# Patient Record
Sex: Male | Born: 1970 | Race: White | Hispanic: No | Marital: Married | Smoking: Former smoker
Health system: Southern US, Community
[De-identification: ages and names within clinical notes are randomized; demographics above are authoritative.]

---

## 2007-03-11 ENCOUNTER — Emergency Department: Payer: Self-pay | Admitting: Internal Medicine

## 2007-03-20 ENCOUNTER — Emergency Department: Payer: Self-pay

## 2008-10-19 IMAGING — CR LEFT INDEX FINGER 2+V
1 series · 3 of 3 positions shown · non-contrast
Comparison: none

REASON FOR EXAM: crush injury
COMMENTS:

PROCEDURE:     DXR - DXR FINGER INDEX 2ND DIGIT LT HA  - March 11, 2007  [DATE]
RESULT:     There is opaque material projected over the soft tissues of the
distal phalanx and compatible with opaque material on the skin or in soft
tissues.

[Series 1: view not recorded · 0.17mm/px · 3 of 3 slices shown]
[im 1/3]
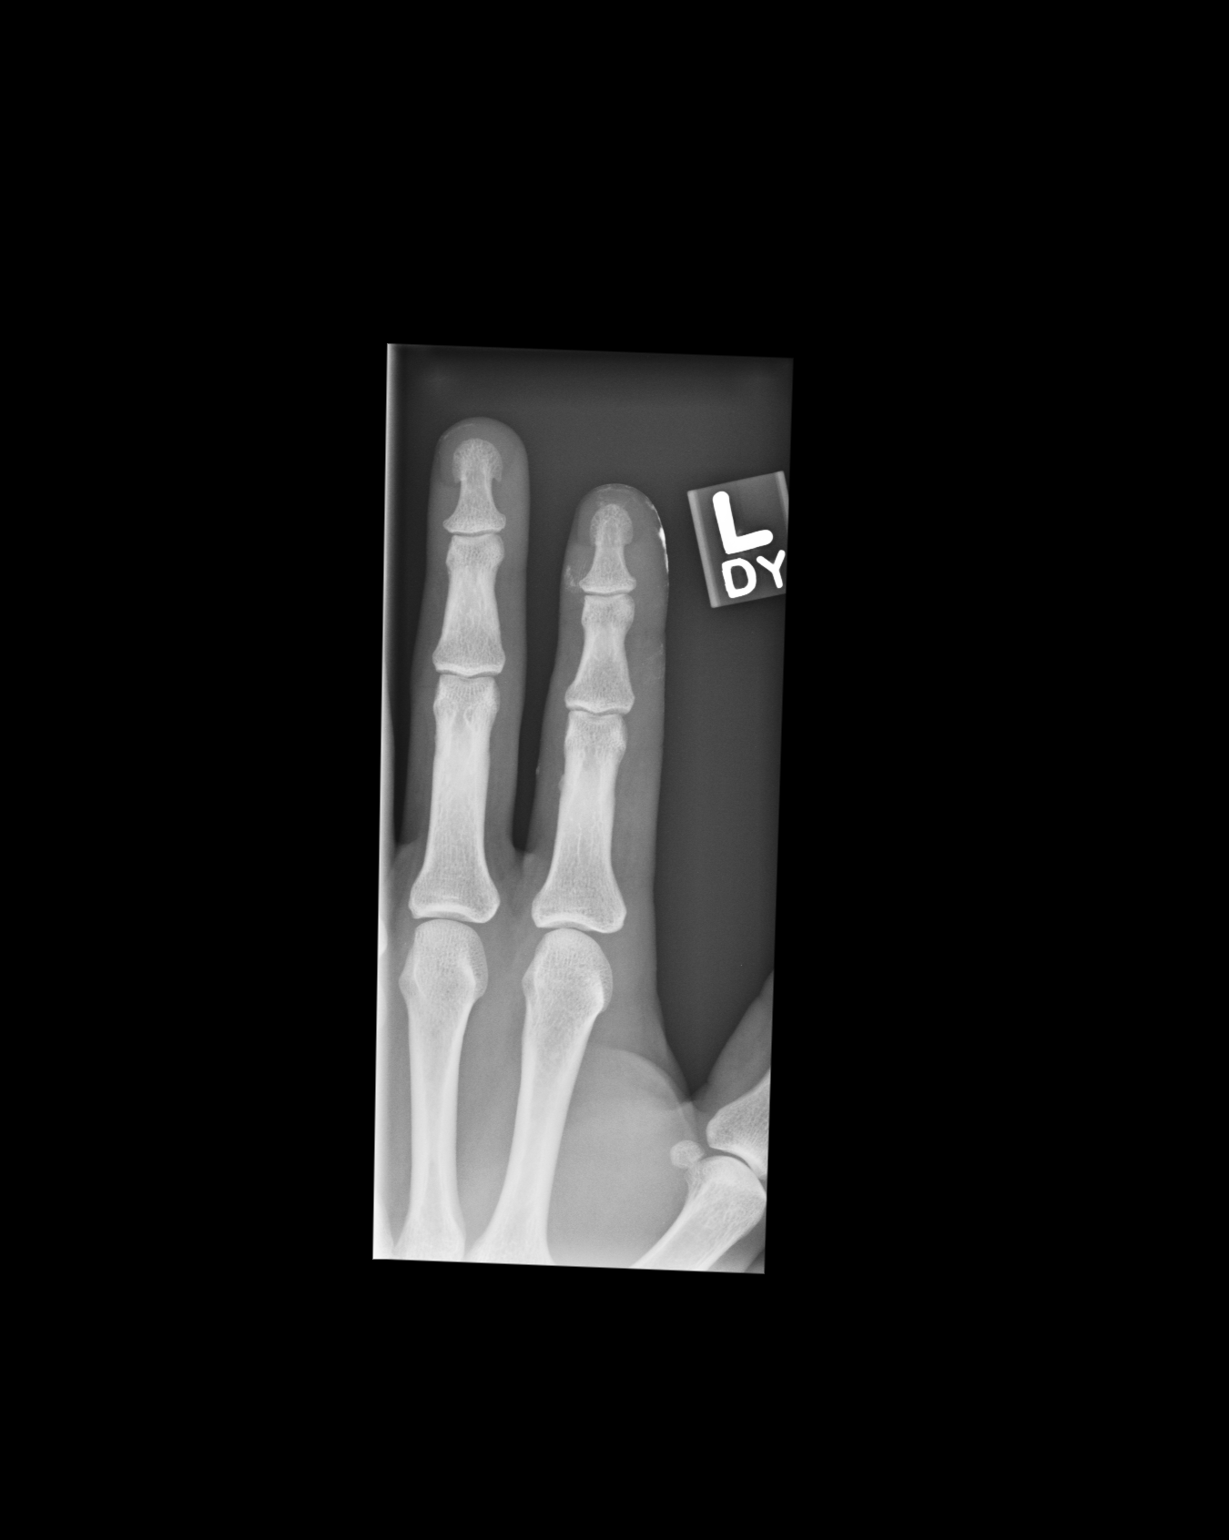
[im 2/3]
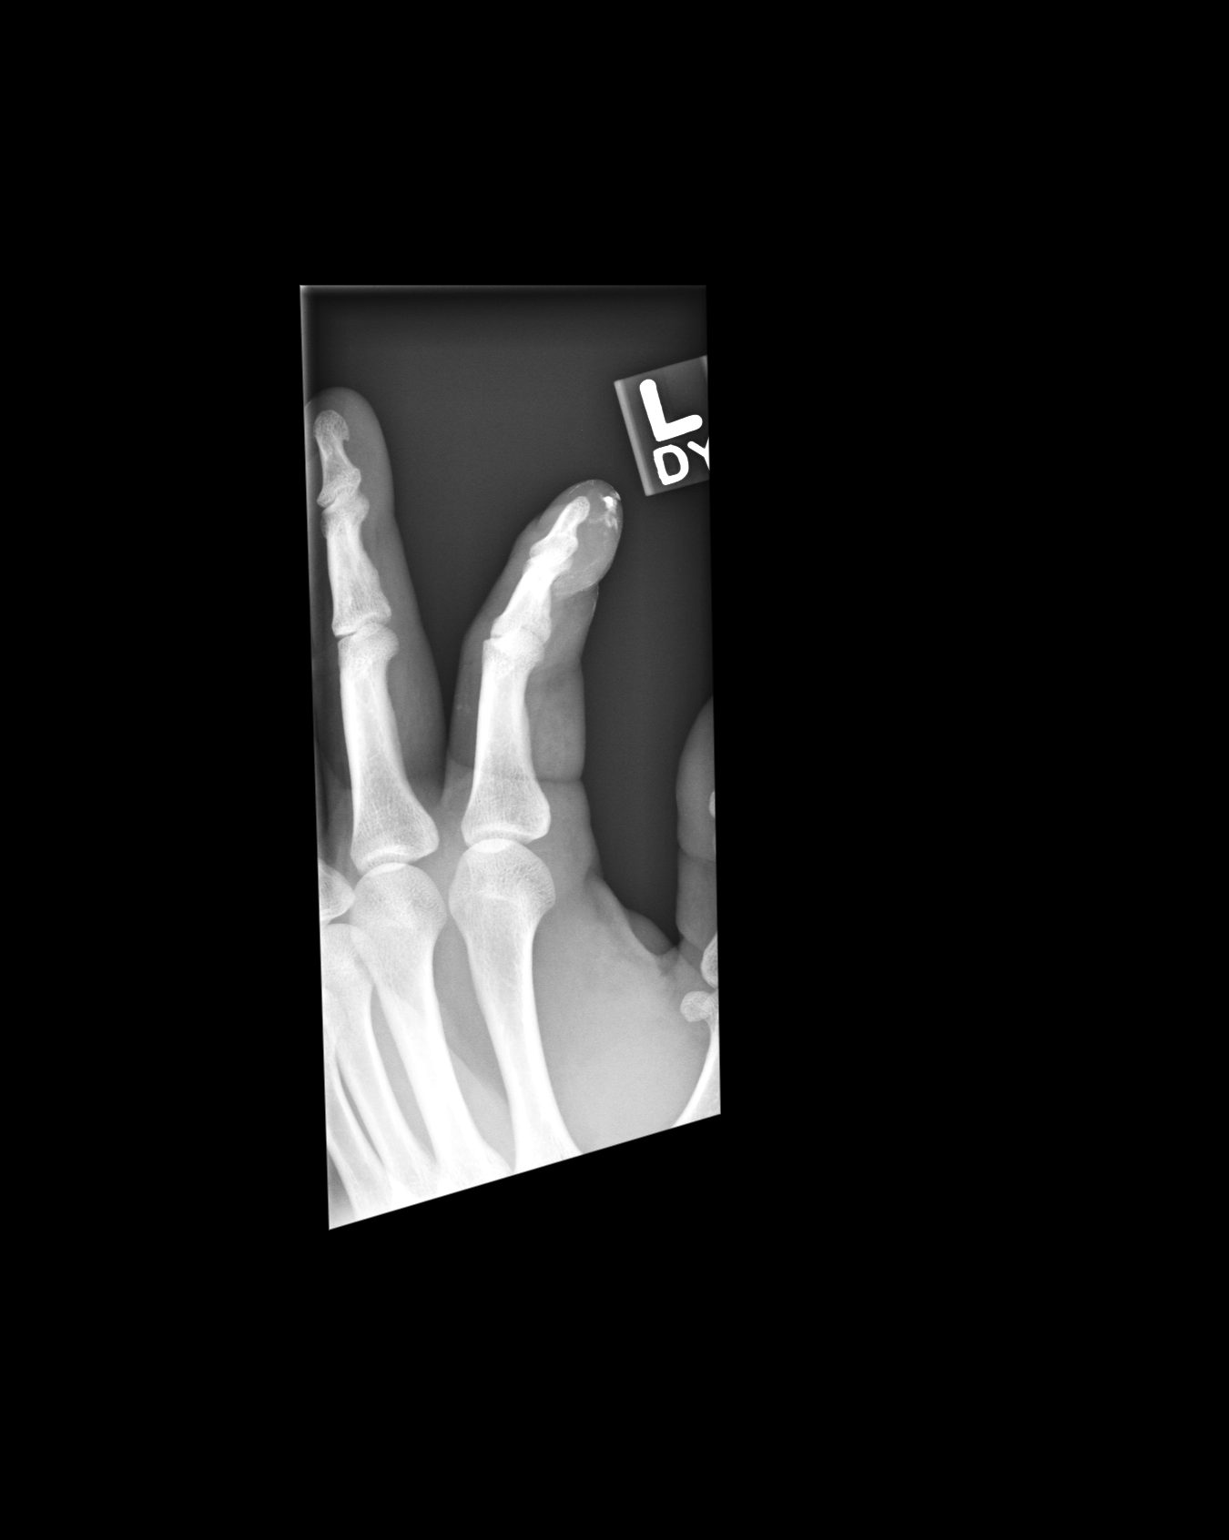
[im 3/3]
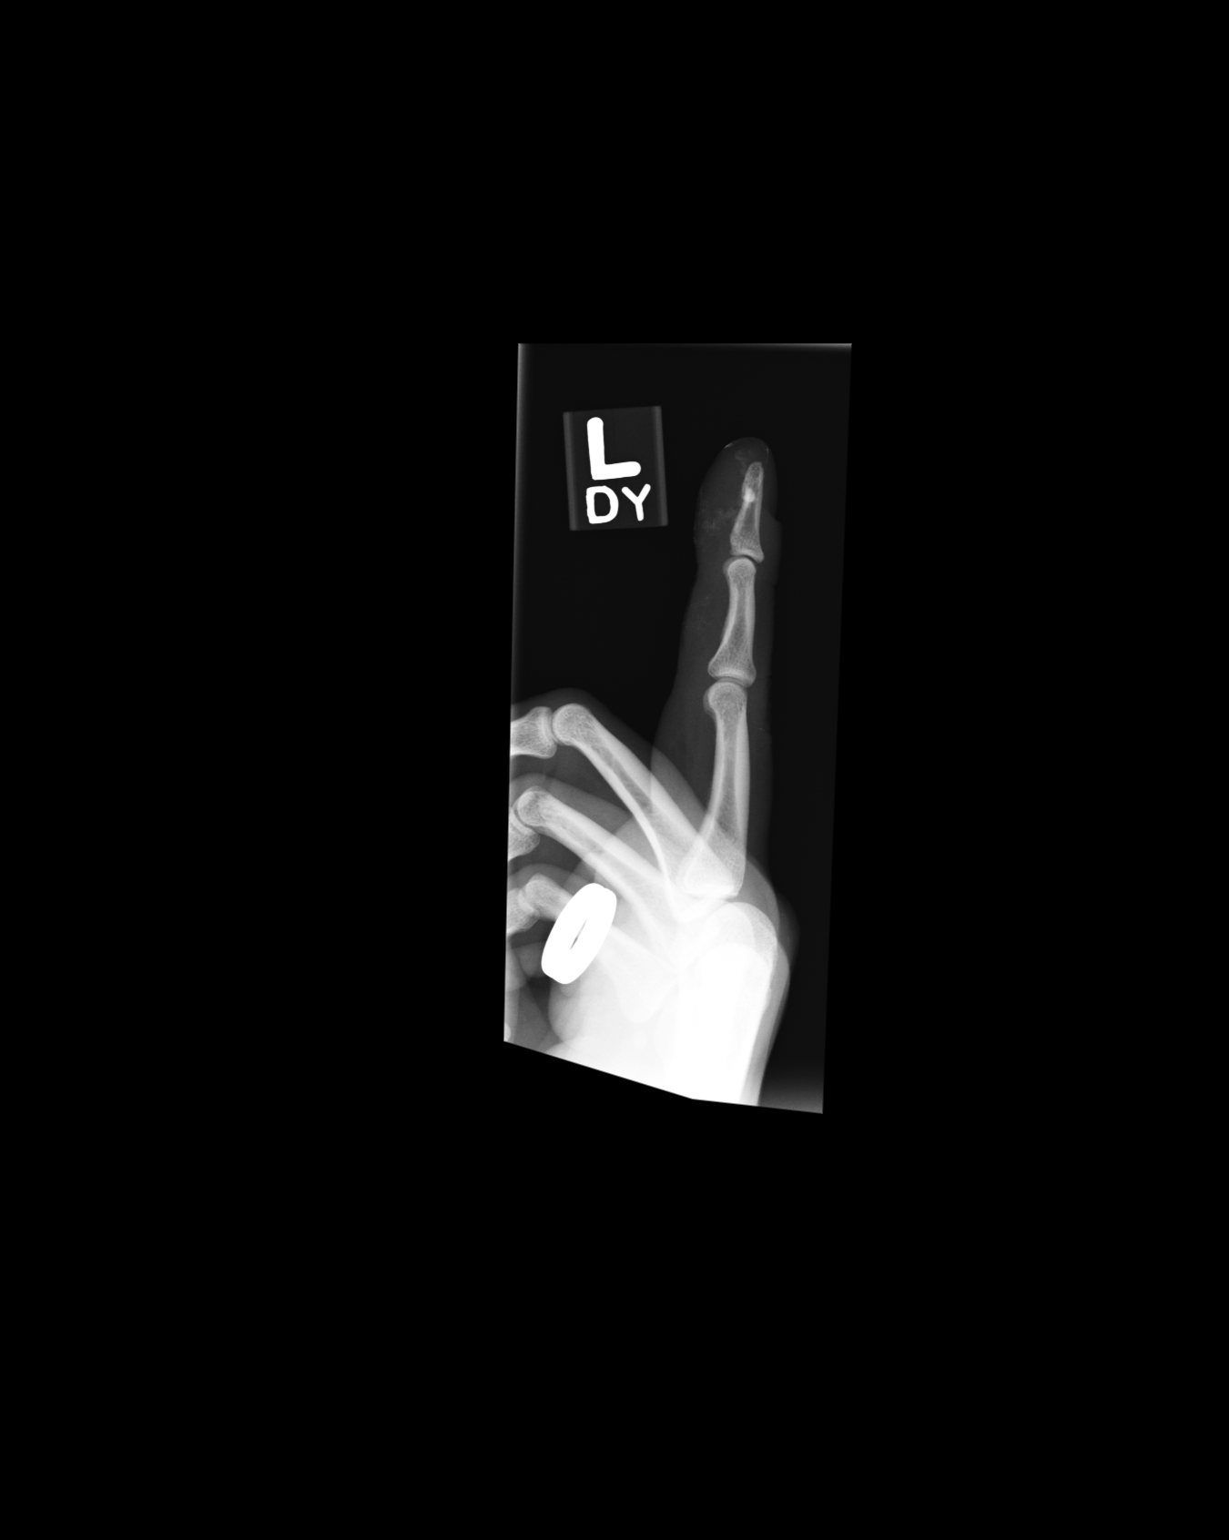

[3 of 3 positions shown; findings below may reference images not displayed]

IMPRESSION: 1. No fracture or other acute bony abnormality is seen.
2. There is opaque material projected over the soft tissues distally and
which appears to be primarily on the skin.

## 2020-08-04 ENCOUNTER — Encounter: Payer: Self-pay | Admitting: Podiatry

## 2020-08-04 ENCOUNTER — Other Ambulatory Visit: Payer: Self-pay

## 2020-08-04 ENCOUNTER — Ambulatory Visit (INDEPENDENT_AMBULATORY_CARE_PROVIDER_SITE_OTHER): Payer: 59

## 2020-08-04 ENCOUNTER — Ambulatory Visit (INDEPENDENT_AMBULATORY_CARE_PROVIDER_SITE_OTHER): Payer: 59 | Admitting: Podiatry

## 2020-08-04 DIAGNOSIS — M62462 Contracture of muscle, left lower leg: Secondary | ICD-10-CM

## 2020-08-04 DIAGNOSIS — M722 Plantar fascial fibromatosis: Secondary | ICD-10-CM

## 2020-08-04 DIAGNOSIS — M216X1 Other acquired deformities of right foot: Secondary | ICD-10-CM

## 2020-08-04 DIAGNOSIS — M21861 Other specified acquired deformities of right lower leg: Secondary | ICD-10-CM

## 2020-08-04 DIAGNOSIS — M216X2 Other acquired deformities of left foot: Secondary | ICD-10-CM | POA: Diagnosis not present

## 2020-08-04 DIAGNOSIS — K219 Gastro-esophageal reflux disease without esophagitis: Secondary | ICD-10-CM | POA: Insufficient documentation

## 2020-08-04 DIAGNOSIS — M62461 Contracture of muscle, right lower leg: Secondary | ICD-10-CM

## 2020-08-04 DIAGNOSIS — M21862 Other specified acquired deformities of left lower leg: Secondary | ICD-10-CM

## 2020-08-04 MED ORDER — MELOXICAM 15 MG PO TABS: 15.0000 mg | ORAL_TABLET | Freq: Every day | ORAL | 3 refills | Status: AC

## 2020-08-04 NOTE — Progress Notes (Signed)
  Subjective:  Patient ID: Richard Lozano, male    DOB: March 28, 1971,  MRN: [id number]  Chief Complaint  Patient presents with  . Foot Pain    Patient presents today for left heel and bilat arch pain x 3 weeks.  He says "it hurts more when Im standing up and its a nagging ache."  He says he occasionally has arch pain bilat feet.  He also c/o numbness at base of right great toenail x 3-4 weeks    49 y.o. male presents with the above complaint. History confirmed with patient.  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  He has had a number of orthopedic injuries in the last year and a half and has not been exercising until recently.  Pain began after he resumed exercise and gym work.  Objective:  Physical Exam: warm, good capillary refill, no trophic changes or ulcerative lesions, normal DP and PT pulses and normal sensory exam.  Bilaterally he has Mild pain in the mid plantar fascia, he has significant gastroc equinus with a positive Silfverskiold test.  No sharp pain at the insertion of the plantar fascia on the plantar heel.  He has pes cavus with a plantarflexed first ray.   Radiographs: X-ray of both feet: Bilateral pes cavus foot type with tiny plantar and posterior calcaneal enthesophytes Assessment:   1. Plantar fasciitis   2. Acquired bilateral pes cavus   3. Gastrocnemius equinus of left lower extremity   4. Gastrocnemius equinus of right lower extremity      Plan:  Patient was evaluated and treated and all questions answered.   Symptoms most closely resolving plantar fasciitis although they are not classic in nature.  He has pain in the mid plantar fascia.  I think it still be good for him to pursue conservative treatment for this and begin anti-inflammatory use and stretching.  This should help with his significant gastrocnemius equinus which I think is increasing plantar pressure and strain.  Hopefully this is an overuse injury after beginning exercise recently.   With his pes cavus foot type, I think that prefabricated orthoses were not doing much good and he would benefit from a custom molded orthosis.  He will consider this for long-term.  Should do well with an arch support to fill the arch and either a reverse Morton's first ray cut out to allow accommodation of his plantarflexed first ray.  -Rx for meloxicam 15 mg sent to pharmacy -Stretching icing reviewed and exercise were given  Return in about 6 weeks (around 09/15/2020).

## 2020-08-04 NOTE — Patient Instructions (Signed)
Check with your insurance if they cover custom molded orthotics. Check with them for the procedure code L3020 (orthotics code) and the diagnosis code (M72.2)   Plantar Fasciitis (Heel Spur Syndrome) with Rehab The plantar fascia is a fibrous, ligament-like, soft-tissue structure that spans the bottom of the foot. Plantar fasciitis is a condition that causes pain in the foot due to inflammation of the tissue. SYMPTOMS   Pain and tenderness on the underneath side of the foot.  Pain that worsens with standing or walking. CAUSES  Plantar fasciitis is caused by irritation and injury to the plantar fascia on the underneath side of the foot. Common mechanisms of injury include:  Direct trauma to bottom of the foot.  Damage to a small nerve that runs under the foot where the main fascia attaches to the heel bone.  Stress placed on the plantar fascia due to bone spurs. RISK INCREASES WITH:   Activities that place stress on the plantar fascia (running, jumping, pivoting, or cutting).  Poor strength and flexibility.  Improperly fitted shoes.  Tight calf muscles.  Flat feet.  Failure to warm-up properly before activity.  Obesity. PREVENTION  Warm up and stretch properly before activity.  Allow for adequate recovery between workouts.  Maintain physical fitness:  Strength, flexibility, and endurance.  Cardiovascular fitness.  Maintain a health body weight.  Avoid stress on the plantar fascia.  Wear properly fitted shoes, including arch supports for individuals who have flat feet.  PROGNOSIS  If treated properly, then the symptoms of plantar fasciitis usually resolve without surgery. However, occasionally surgery is necessary.  RELATED COMPLICATIONS   Recurrent symptoms that may result in a chronic condition.  Problems of the lower back that are caused by compensating for the injury, such as limping.  Pain or weakness of the foot during push-off following  surgery.  Chronic inflammation, scarring, and partial or complete fascia tear, occurring more often from repeated injections.  TREATMENT  Treatment initially involves the use of ice and medication to help reduce pain and inflammation. The use of strengthening and stretching exercises may help reduce pain with activity, especially stretches of the Achilles tendon. These exercises may be performed at home or with a therapist. Your caregiver may recommend that you use heel cups of arch supports to help reduce stress on the plantar fascia. Occasionally, corticosteroid injections are given to reduce inflammation. If symptoms persist for greater than 6 months despite non-surgical (conservative), then surgery may be recommended.   MEDICATION   If pain medication is necessary, then nonsteroidal anti-inflammatory medications, such as aspirin and ibuprofen, or other minor pain relievers, such as acetaminophen, are often recommended.  Do not take pain medication within 7 days before surgery.  Prescription pain relievers may be given if deemed necessary by your caregiver. Use only as directed and only as much as you need.  Corticosteroid injections may be given by your caregiver. These injections should be reserved for the most serious cases, because they may only be given a certain number of times.  HEAT AND COLD  Cold treatment (icing) relieves pain and reduces inflammation. Cold treatment should be applied for 10 to 15 minutes every 2 to 3 hours for inflammation and pain and immediately after any activity that aggravates your symptoms. Use ice packs or massage the area with a piece of ice (ice massage).  Heat treatment may be used prior to performing the stretching and strengthening activities prescribed by your caregiver, physical therapist, or athletic trainer. Use a heat pack  or soak the injury in warm water.  SEEK IMMEDIATE MEDICAL CARE IF:  Treatment seems to offer no benefit, or the condition  worsens.  Any medications produce adverse side effects.  EXERCISES- RANGE OF MOTION (ROM) AND STRETCHING EXERCISES - Plantar Fasciitis (Heel Spur Syndrome) These exercises may help you when beginning to rehabilitate your injury. Your symptoms may resolve with or without further involvement from your physician, physical therapist or athletic trainer. While completing these exercises, remember:   Restoring tissue flexibility helps normal motion to return to the joints. This allows healthier, less painful movement and activity.  An effective stretch should be held for at least 30 seconds.  A stretch should never be painful. You should only feel a gentle lengthening or release in the stretched tissue.  RANGE OF MOTION - Toe Extension, Flexion  Sit with your right / left leg crossed over your opposite knee.  Grasp your toes and gently pull them back toward the top of your foot. You should feel a stretch on the bottom of your toes and/or foot.  Hold this stretch for 10 seconds.  Now, gently pull your toes toward the bottom of your foot. You should feel a stretch on the top of your toes and or foot.  Hold this stretch for 10 seconds. Repeat  times. Complete this stretch 3 times per day.   RANGE OF MOTION - Ankle Dorsiflexion, Active Assisted  Remove shoes and sit on a chair that is preferably not on a carpeted surface.  Place right / left foot under knee. Extend your opposite leg for support.  Keeping your heel down, slide your right / left foot back toward the chair until you feel a stretch at your ankle or calf. If you do not feel a stretch, slide your bottom forward to the edge of the chair, while still keeping your heel down.  Hold this stretch for 10 seconds. Repeat 3 times. Complete this stretch 2 times per day.   These exercises are the most important ones for you!: STRETCH  Gastroc, Standing  Place hands on wall.  Extend right / left leg, keeping the front knee somewhat  bent.  Slightly point your toes inward on your back foot.  Keeping your right / left heel on the floor and your knee straight, shift your weight toward the wall, not allowing your back to arch.  You should feel a gentle stretch in the right / left calf. Hold this position for 10 seconds. Repeat 3 times. Complete this stretch 2 times per day.  STRETCH  Soleus, Standing  Place hands on wall.  Extend right / left leg, keeping the other knee somewhat bent.  Slightly point your toes inward on your back foot.  Keep your right / left heel on the floor, bend your back knee, and slightly shift your weight over the back leg so that you feel a gentle stretch deep in your back calf.  Hold this position for 10 seconds. Repeat 3 times. Complete this stretch 2 times per day.  STRETCH  Gastrocsoleus, Standing  Note: This exercise can place a lot of stress on your foot and ankle. Please complete this exercise only if specifically instructed by your caregiver.   Place the ball of your right / left foot on a step, keeping your other foot firmly on the same step.  Hold on to the wall or a rail for balance.  Slowly lift your other foot, allowing your body weight to press your heel down over  the edge of the step.  You should feel a stretch in your right / left calf.  Hold this position for 10 seconds.  Repeat this exercise with a slight bend in your right / left knee. Repeat 3 times. Complete this stretch 2 times per day.   STRENGTHENING EXERCISES - Plantar Fasciitis (Heel Spur Syndrome)  These exercises may help you when beginning to rehabilitate your injury. They may resolve your symptoms with or without further involvement from your physician, physical therapist or athletic trainer. While completing these exercises, remember:   Muscles can gain both the endurance and the strength needed for everyday activities through controlled exercises.  Complete these exercises as instructed by your  physician, physical therapist or athletic trainer. Progress the resistance and repetitions only as guided.  STRENGTH - Towel Curls  Sit in a chair positioned on a non-carpeted surface.  Place your foot on a towel, keeping your heel on the floor.  Pull the towel toward your heel by only curling your toes. Keep your heel on the floor. Repeat 3 times. Complete this exercise 2 times per day.  STRENGTH - Ankle Inversion  Secure one end of a rubber exercise band/tubing to a fixed object (table, pole). Loop the other end around your foot just before your toes.  Place your fists between your knees. This will focus your strengthening at your ankle.  Slowly, pull your big toe up and in, making sure the band/tubing is positioned to resist the entire motion.  Hold this position for 10 seconds.  Have your muscles resist the band/tubing as it slowly pulls your foot back to the starting position. Repeat 3 times. Complete this exercises 2 times per day.  Document Released: 07/24/2005 Document Revised: 10/16/2011 Document Reviewed: 11/05/2008 [facility] Patient Information 2014 [place], [place].

## 2020-09-15 ENCOUNTER — Other Ambulatory Visit: Payer: Self-pay

## 2020-09-15 ENCOUNTER — Encounter: Payer: Self-pay | Admitting: Podiatry

## 2020-09-15 ENCOUNTER — Ambulatory Visit: Payer: 59 | Admitting: Podiatry

## 2020-09-15 DIAGNOSIS — M21861 Other specified acquired deformities of right lower leg: Secondary | ICD-10-CM

## 2020-09-15 DIAGNOSIS — M216X1 Other acquired deformities of right foot: Secondary | ICD-10-CM

## 2020-09-15 DIAGNOSIS — M216X2 Other acquired deformities of left foot: Secondary | ICD-10-CM

## 2020-09-15 DIAGNOSIS — M722 Plantar fascial fibromatosis: Secondary | ICD-10-CM

## 2020-09-15 DIAGNOSIS — M21862 Other specified acquired deformities of left lower leg: Secondary | ICD-10-CM

## 2020-09-15 MED ORDER — TRIAMCINOLONE ACETONIDE 10 MG/ML IJ SUSP
5.0000 mg | Freq: Once | INTRAMUSCULAR | Status: AC
  Administered 2020-09-15: 5 mg

## 2020-09-15 MED ORDER — DEXAMETHASONE SODIUM PHOSPHATE 4 MG/ML IJ SOLN
2.0000 mg | Freq: Once | INTRAMUSCULAR | Status: AC
  Administered 2020-09-15: 2 mg

## 2020-09-15 NOTE — Progress Notes (Signed)
  Subjective:  Patient ID: Richard Lozano, male    DOB: 08/23/1970,  MRN: [id number]  Chief Complaint  Patient presents with  . Plantar Fasciitis    "my right heel is good, but my left heel still hurts in the center of my heel.  The pains come and go and yesterday was not a good day"    50 y.o. male presents with the above complaint. History confirmed with patient.  Says he is only been stretching every other day.  Is having a hard time finding boots to wear that are comfortable, he has been using a gel pad in the heel  Objective:  Physical Exam: warm, good capillary refill, no trophic changes or ulcerative lesions, normal DP and PT pulses and normal sensory exam.  Bilaterally he has significant gastroc equinus with a positive Silfverskiold test.  He has pes cavus with a plantarflexed first ray.  Today he has sharp pain on the plantar left heel, not on the right, no pain in the mid fascia bilaterally   Radiographs: X-ray of both feet: Bilateral pes cavus foot type with tiny plantar and posterior calcaneal enthesophytes Assessment:   1. Plantar fasciitis   2. Acquired bilateral pes cavus   3. Gastrocnemius equinus of left lower extremity   4. Gastrocnemius equinus of right lower extremity      Plan:  Patient was evaluated and treated and all questions answered.    -Recommended Tuli's heel cups -Continue meloxicam -Stretching icing reviewed and exercise were given, I recommended strongly that he do these twice daily -Injection given to the left heel today  After sterile prep with povidone-iodine solution and alcohol, the left heel was injected with 0.5cc 2% xylocaine plain, 0.5cc 0.5% marcaine plain, 5mg  triamcinolone acetonide, and 2mg  dexamethasone was injected along the plantar fascia at the insertion on the plantar calcaneus. The patient tolerated the procedure well without complication.   Return in about 7 weeks (around 11/03/2020).

## 2020-11-03 ENCOUNTER — Ambulatory Visit: Payer: 59 | Admitting: Podiatry
